# Patient Record
Sex: Female | Born: 1990 | Race: White | Hispanic: No | Marital: Single | Smoking: Current every day smoker
Health system: Southern US, Community
[De-identification: ages and names within clinical notes are randomized; demographics above are authoritative.]

## PROBLEM LIST (undated history)

## (undated) DIAGNOSIS — L98499 Non-pressure chronic ulcer of skin of other sites with unspecified severity: Secondary | ICD-10-CM

---

## 2010-06-23 ENCOUNTER — Emergency Department (HOSPITAL_COMMUNITY): Admission: EM | Admit: 2010-06-23 | Discharge: 2010-06-23 | Payer: Self-pay | Admitting: Emergency Medicine

## 2010-07-02 ENCOUNTER — Ambulatory Visit (HOSPITAL_COMMUNITY): Admission: RE | Admit: 2010-07-02 | Discharge: 2010-07-02 | Payer: Self-pay | Admitting: General Surgery

## 2011-01-04 ENCOUNTER — Emergency Department (HOSPITAL_COMMUNITY)
Admission: EM | Admit: 2011-01-04 | Discharge: 2011-01-04 | Payer: Self-pay | Source: Home / Self Care | Admitting: Emergency Medicine

## 2011-01-06 LAB — DIFFERENTIAL
Basophils Absolute: 0 10*3/uL (ref 0.0–0.1)
Basophils Relative: 0 % (ref 0–1)
Eosinophils Absolute: 0 10*3/uL (ref 0.0–0.7)
Eosinophils Relative: 0 % (ref 0–5)
Lymphocytes Relative: 7 % — ABNORMAL LOW (ref 12–46)
Lymphs Abs: 1 10*3/uL (ref 0.7–4.0)
Monocytes Absolute: 0.8 10*3/uL (ref 0.1–1.0)
Monocytes Relative: 6 % (ref 3–12)
Neutro Abs: 12.4 10*3/uL — ABNORMAL HIGH (ref 1.7–7.7)
Neutrophils Relative %: 87 % — ABNORMAL HIGH (ref 43–77)

## 2011-01-06 LAB — URINALYSIS, ROUTINE W REFLEX MICROSCOPIC
Hgb urine dipstick: NEGATIVE
Ketones, ur: >80 mg/dL — AB
Leukocytes, UA: NEGATIVE
Nitrite: NEGATIVE
Protein, ur: 30 mg/dL — AB
Specific Gravity, Urine: >1.03 — ABNORMAL HIGH (ref 1.005–1.030)
Urine Glucose, Fasting: NEGATIVE mg/dL
Urobilinogen, UA: 1 mg/dL (ref 0.0–1.0)
pH: 6 (ref 5.0–8.0)

## 2011-01-06 LAB — BASIC METABOLIC PANEL
BUN: 12 mg/dL (ref 6–23)
CO2: 20 mEq/L (ref 19–32)
Calcium: 9.1 mg/dL (ref 8.4–10.5)
Chloride: 105 mEq/L (ref 96–112)
Creatinine, Ser: 0.79 mg/dL (ref 0.4–1.2)
GFR calc Af Amer: >60 mL/min (ref >60)
GFR calc non Af Amer: >60 mL/min (ref >60)
Glucose, Bld: 76 mg/dL (ref 70–99)
Potassium: 3.8 mEq/L (ref 3.5–5.1)
Sodium: 138 mEq/L (ref 135–145)

## 2011-01-06 LAB — URINE MICROSCOPIC-ADD ON

## 2011-01-06 LAB — CBC
HCT: 39.5 % (ref 36.0–46.0)
Hemoglobin: 14.1 g/dL (ref 12.0–15.0)
MCH: 33.3 pg (ref 26.0–34.0)
MCHC: 35.7 g/dL (ref 30.0–36.0)
MCV: 93.2 fL (ref 78.0–100.0)
Platelets: 243 10*3/uL (ref 150–400)
RBC: 4.24 MIL/uL (ref 3.87–5.11)
RDW: 12.2 % (ref 11.5–15.5)
WBC: 14.2 10*3/uL — ABNORMAL HIGH (ref 4.0–10.5)

## 2011-03-07 LAB — CBC
HCT: 41.9 % (ref 36.0–46.0)
Hemoglobin: 14.5 g/dL (ref 12.0–15.0)
MCH: 34 pg (ref 26.0–34.0)
MCHC: 34.7 g/dL (ref 30.0–36.0)
MCV: 97.9 fL (ref 78.0–100.0)
Platelets: 194 10*3/uL (ref 150–400)
RBC: 4.28 MIL/uL (ref 3.87–5.11)
RDW: 12.8 % (ref 11.5–15.5)
WBC: 9.4 10*3/uL (ref 4.0–10.5)

## 2011-03-07 LAB — HCG, SERUM, QUALITATIVE: Preg, Serum: POSITIVE — AB

## 2012-05-12 IMAGING — CT CT HEAD W/O CM
3 of 5 series · 16 of 47 positions shown, 19 images · non-contrast
Comparison: None.

CLINICAL DATA: Head trauma secondary to a fall.

CT HEAD WITHOUT CONTRAST
TECHNIQUE: Contiguous axial images were obtained from the base of
the skull through the vertex without contrast.

[Series 4: max st 2.0 h31s · axial · 0.30mm/px · z∈[+14,+142]mm · 10 of 76 slices shown, 13 images]
[im 6/76  brain]
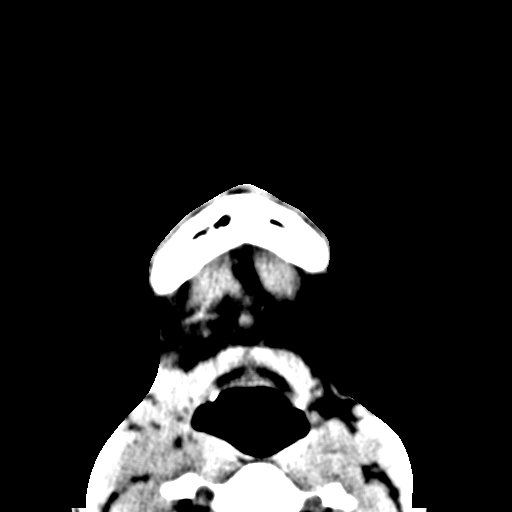
[im 6/76  bone]
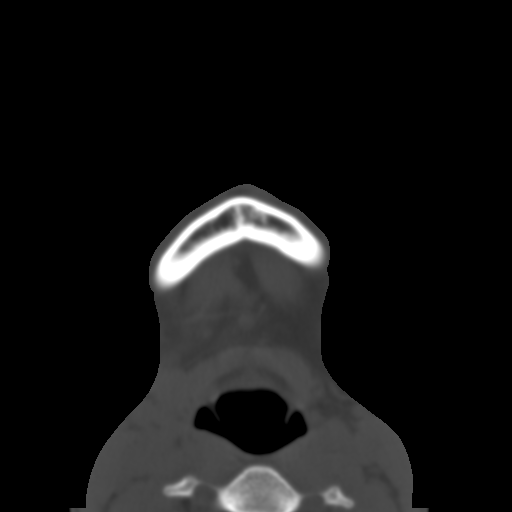
[im 12/76  brain]
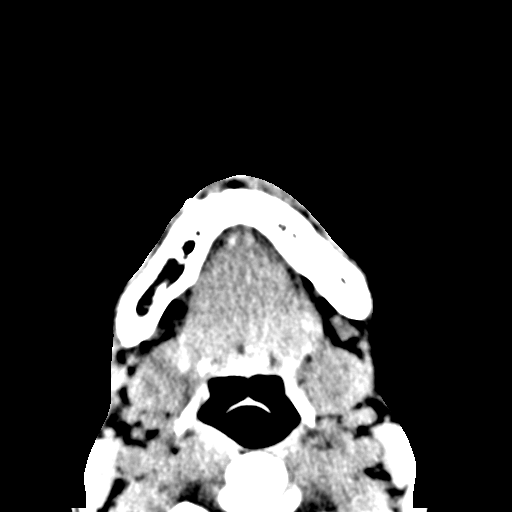
[im 24/76  brain]
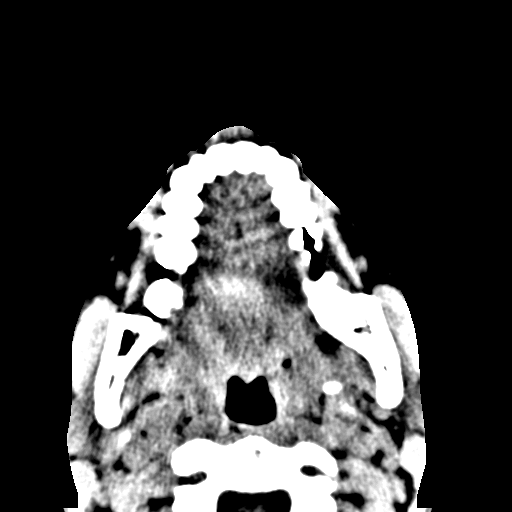
[im 29/76  brain]
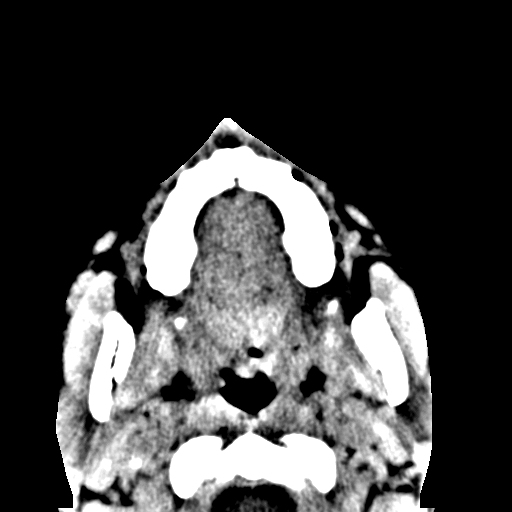
[im 35/76  brain]
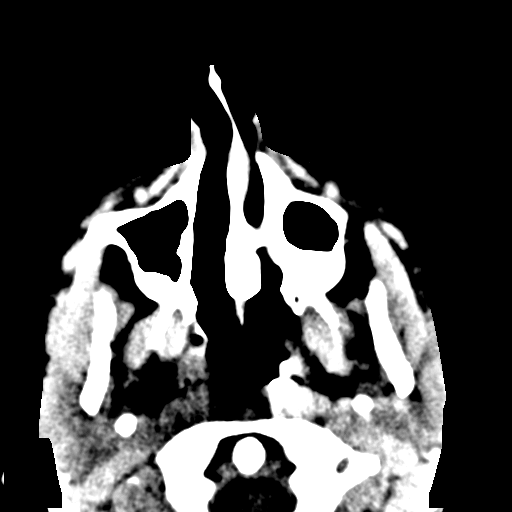
[im 35/76  bone]
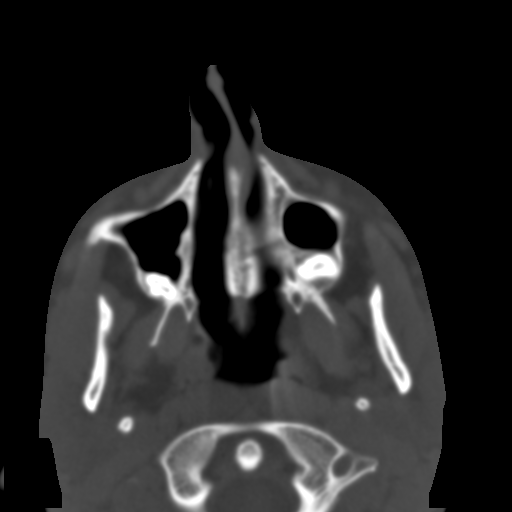
[im 41/76  brain]
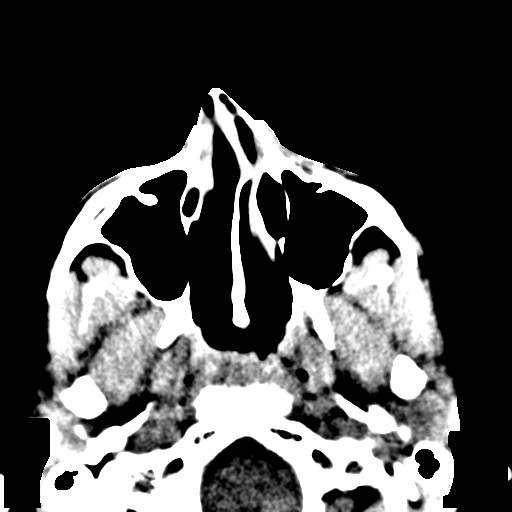
[im 47/76  brain]
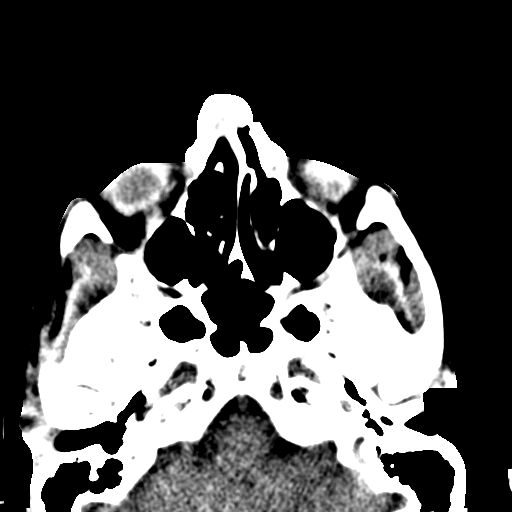
[im 58/76  brain]
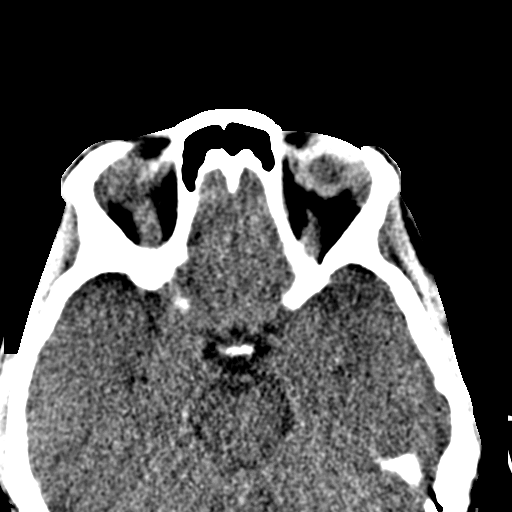
[im 64/76  brain]
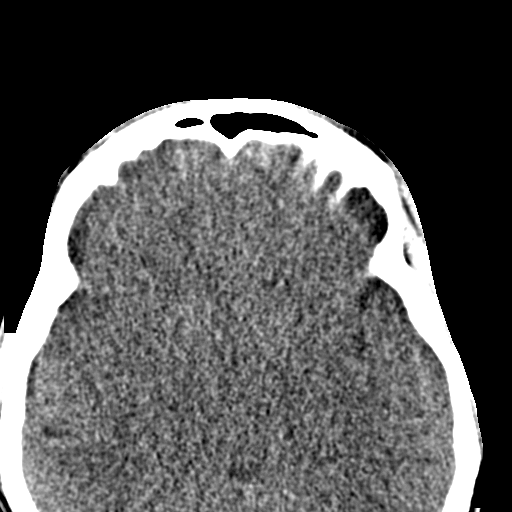
[im 64/76  bone]
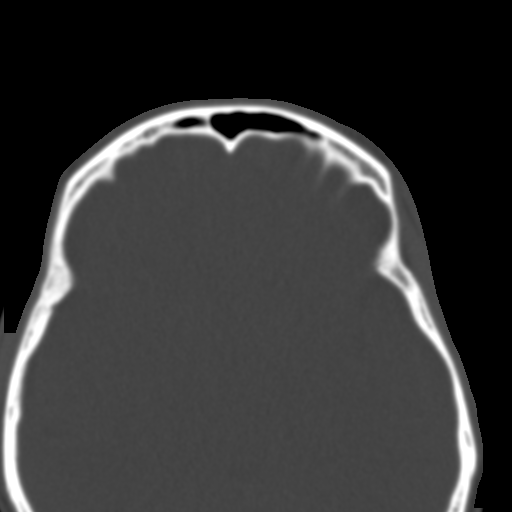
[im 70/76  brain]
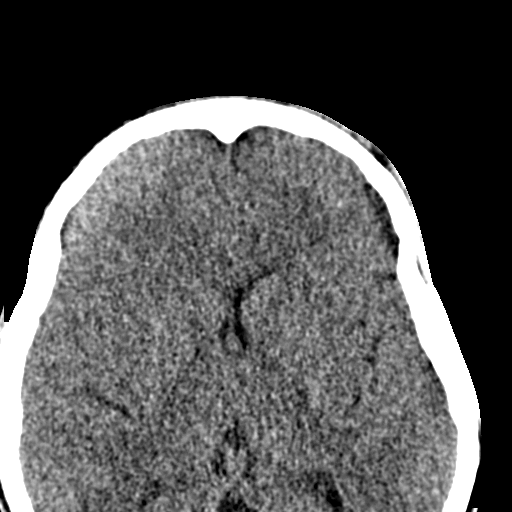

[Series 6: max st coronal · coronal · 0.28mm/px · 3 of 62 slices shown]
[im 21/62  brain]
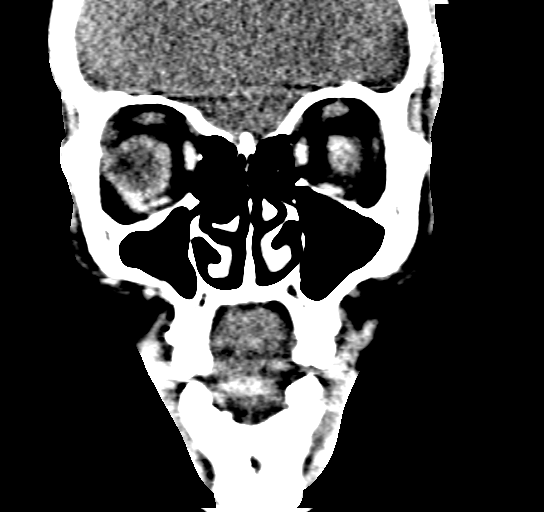
[im 28/62  brain]
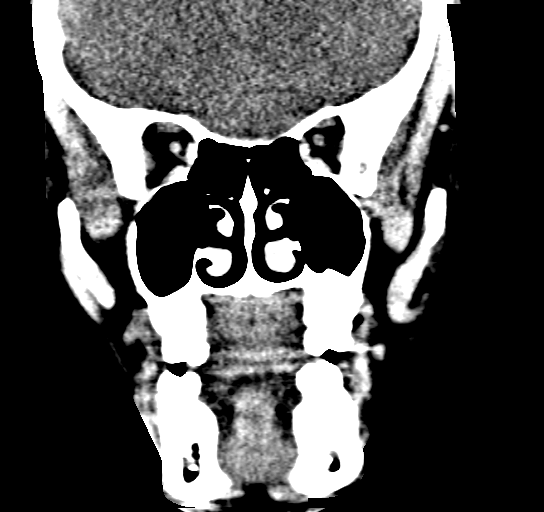
[im 34/62  brain]
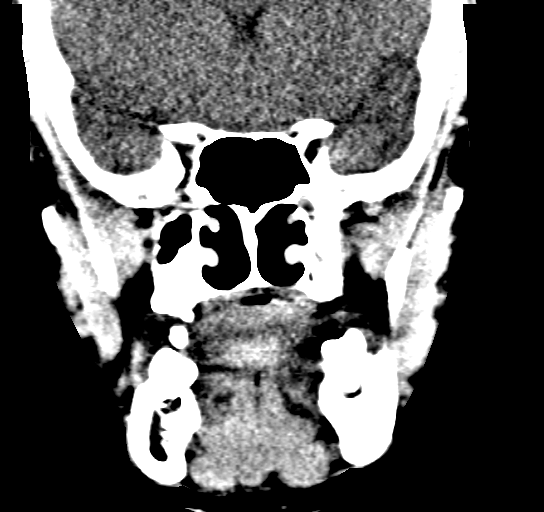

[Series 7: max st sag · sagittal · 0.29mm/px · 3 of 66 slices shown]
[im 22/66  brain]
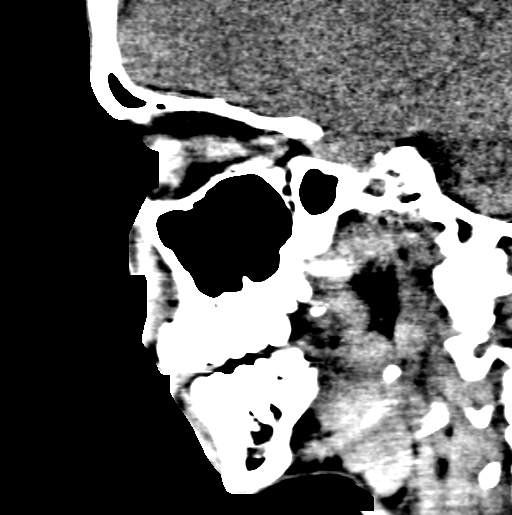
[im 33/66  brain]
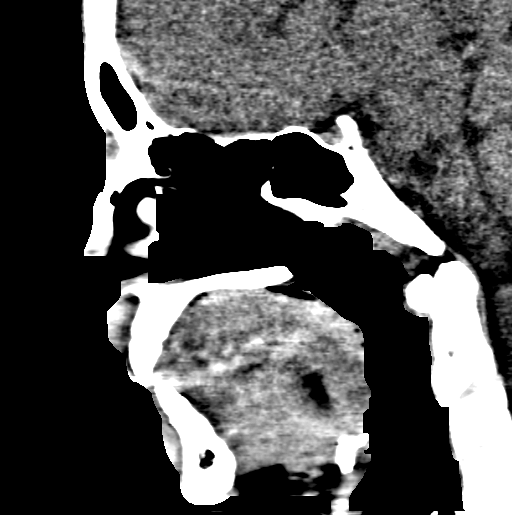
[im 44/66  brain]
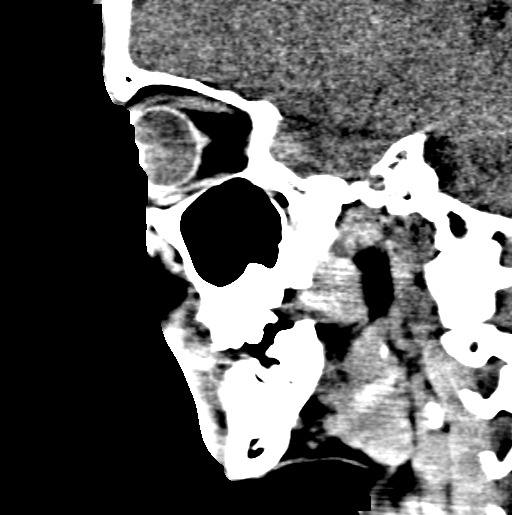

[16 of 47 positions shown; findings below may reference images not displayed]

FINDINGS: There is no  intracranial hemorrhage, acute infarction,
or intracranial mass lesion.  The brain parenchyma is normal.
There is a scalp hematoma above  the left orbit.  There is also a
displaced nasal bone fracture.

No other significant abnormalities.
IMPRESSION: 1.  No acute intracranial abnormality.
2.  Scalp hematoma.
3.  Nasal bone fracture.

## 2021-10-11 ENCOUNTER — Emergency Department (HOSPITAL_COMMUNITY): Payer: Medicaid Other

## 2021-10-11 ENCOUNTER — Emergency Department (HOSPITAL_COMMUNITY)
Admission: EM | Admit: 2021-10-11 | Discharge: 2021-10-11 | Disposition: A | Discharge status: Home / Self Care | Payer: Medicaid Other | Attending: Emergency Medicine | Admitting: Emergency Medicine

## 2021-10-11 ENCOUNTER — Encounter (HOSPITAL_COMMUNITY): Payer: Self-pay | Admitting: *Deleted

## 2021-10-11 DIAGNOSIS — E876 Hypokalemia: Secondary | ICD-10-CM | POA: Insufficient documentation

## 2021-10-11 DIAGNOSIS — J101 Influenza due to other identified influenza virus with other respiratory manifestations: Secondary | ICD-10-CM | POA: Insufficient documentation

## 2021-10-11 DIAGNOSIS — E871 Hypo-osmolality and hyponatremia: Secondary | ICD-10-CM | POA: Diagnosis not present

## 2021-10-11 DIAGNOSIS — J111 Influenza due to unidentified influenza virus with other respiratory manifestations: Secondary | ICD-10-CM

## 2021-10-11 DIAGNOSIS — F17210 Nicotine dependence, cigarettes, uncomplicated: Secondary | ICD-10-CM | POA: Insufficient documentation

## 2021-10-11 DIAGNOSIS — R079 Chest pain, unspecified: Secondary | ICD-10-CM

## 2021-10-11 DIAGNOSIS — R072 Precordial pain: Secondary | ICD-10-CM | POA: Diagnosis present

## 2021-10-11 DIAGNOSIS — Z20822 Contact with and (suspected) exposure to covid-19: Secondary | ICD-10-CM | POA: Insufficient documentation

## 2021-10-11 HISTORY — DX: Non-pressure chronic ulcer of skin of other sites with unspecified severity: L98.499

## 2021-10-11 LAB — TROPONIN I (HIGH SENSITIVITY)
Troponin I (High Sensitivity): 3 ng/L (ref <18)
Troponin I (High Sensitivity): 2 ng/L (ref <18)

## 2021-10-11 LAB — CBC WITH DIFFERENTIAL/PLATELET
Abs Immature Granulocytes: 0.01 10*3/uL (ref 0.00–0.07)
Basophils Absolute: 0 10*3/uL (ref 0.0–0.1)
Basophils Relative: 0 %
Eosinophils Absolute: 0 10*3/uL (ref 0.0–0.5)
Eosinophils Relative: 0 %
HCT: 46.1 % — ABNORMAL HIGH (ref 36.0–46.0)
Hemoglobin: 16.7 g/dL — ABNORMAL HIGH (ref 12.0–15.0)
Immature Granulocytes: 0 %
Lymphocytes Relative: 24 %
Lymphs Abs: 1.4 10*3/uL (ref 0.7–4.0)
MCH: 35 pg — ABNORMAL HIGH (ref 26.0–34.0)
MCHC: 36.2 g/dL — ABNORMAL HIGH (ref 30.0–36.0)
MCV: 96.6 fL (ref 80.0–100.0)
Monocytes Absolute: 0.7 10*3/uL (ref 0.1–1.0)
Monocytes Relative: 11 %
Neutro Abs: 3.7 10*3/uL (ref 1.7–7.7)
Neutrophils Relative %: 65 %
Platelets: 269 10*3/uL (ref 150–400)
RBC: 4.77 MIL/uL (ref 3.87–5.11)
RDW: 12.1 % (ref 11.5–15.5)
WBC: 5.8 10*3/uL (ref 4.0–10.5)
nRBC: 0 % (ref 0.0–0.2)

## 2021-10-11 LAB — BASIC METABOLIC PANEL
Anion gap: 11 (ref 5–15)
BUN: 7 mg/dL (ref 6–20)
CO2: 27 mmol/L (ref 22–32)
Calcium: 9 mg/dL (ref 8.9–10.3)
Chloride: 96 mmol/L — ABNORMAL LOW (ref 98–111)
Creatinine, Ser: 0.75 mg/dL (ref 0.44–1.00)
GFR, Estimated: >60 mL/min (ref >60)
Glucose, Bld: 84 mg/dL (ref 70–99)
Potassium: 3.3 mmol/L — ABNORMAL LOW (ref 3.5–5.1)
Sodium: 134 mmol/L — ABNORMAL LOW (ref 135–145)

## 2021-10-11 LAB — RESP PANEL BY RT-PCR (FLU A&B, COVID) ARPGX2
Influenza A by PCR: POSITIVE — AB
Influenza B by PCR: NEGATIVE
SARS Coronavirus 2 by RT PCR: NEGATIVE

## 2021-10-11 MED ORDER — KETOROLAC TROMETHAMINE 30 MG/ML IJ SOLN
30.0000 mg | Freq: Once | INTRAMUSCULAR | Status: AC
  Administered 2021-10-11: 30 mg via INTRAVENOUS
  Filled 2021-10-11: qty 1

## 2021-10-11 NOTE — ED Provider Notes (Signed)
[facility] EMERGENCY DEPARTMENT Provider Note   CSN: [id number] Arrival date & time: 10/11/21  1734     History Chief Complaint  Patient presents with   Chest Pain    Candace Mitchell is a 30 y.o. female.  She is here for evaluation of chest pain that is been intermittent for the last 3 days.  She said there is a constant pressure and intermittently there will be much more intense pressure that last for few minutes.  It does not seem to be provoked by exertion.  She is mildly short of breath.  About 2 days prior to this she had high fevers body aches headache cough productive of some white sputum.  Some loose stool.  She thought she had the flu.  The symptoms seem to be improving.  She is a daily smoker.  No past medical history  The history is provided by the patient.  Chest Pain Pain location:  Substernal area Pain quality: pressure   Pain radiates to:  Does not radiate Pain severity:  Moderate Onset quality:  Gradual Duration:  3 days Timing:  Intermittent Progression:  Unchanged Chronicity:  New Relieved by:  None tried Worsened by:  Nothing Ineffective treatments:  None tried Associated symptoms: cough, fever, headache and shortness of breath   Associated symptoms: no abdominal pain, no nausea and no vomiting   Risk factors: smoking   Risk factors: no coronary artery disease, no diabetes mellitus, no high cholesterol, no hypertension and no prior DVT/PE       Past Medical History:  Diagnosis Date   Ulcer of abdomen wall (HCC)     There are no problems to display for this patient.   History reviewed. No pertinent surgical history.   OB History   No obstetric history on file.     No family history on file.  Social History   Tobacco Use   Smoking status: Every Day    Types: Cigarettes   Smokeless tobacco: Never  Substance Use Topics   Alcohol use: Yes    Home Medications Prior to Admission medications   Not on File    Allergies    Patient has  no known allergies.  Review of Systems   Review of Systems  Constitutional:  Positive for fever.  HENT:  Negative for sore throat.   Eyes:  Negative for visual disturbance.  Respiratory:  Positive for cough and shortness of breath.   Cardiovascular:  Positive for chest pain.  Gastrointestinal:  Negative for abdominal pain, nausea and vomiting.  Genitourinary:  Negative for dysuria.  Musculoskeletal:  Positive for myalgias.  Skin:  Negative for rash.  Neurological:  Positive for headaches.   Physical Exam Updated Vital Signs BP 112/74   Pulse 86   Temp 98.4 F (36.9 C) (Oral)   Resp 15   LMP 09/20/2021   SpO2 100%   Physical Exam Vitals and nursing note reviewed.  Constitutional:      General: She is not in acute distress.    Appearance: Normal appearance. She is well-developed.  HENT:     Head: Normocephalic and atraumatic.  Eyes:     Conjunctiva/sclera: Conjunctivae normal.  Cardiovascular:     Rate and Rhythm: Normal rate and regular rhythm.     Heart sounds: Normal heart sounds. No murmur heard. Pulmonary:     Effort: Pulmonary effort is normal. No respiratory distress.     Breath sounds: Normal breath sounds.  Abdominal:     Palpations: Abdomen is  soft.     Tenderness: There is no abdominal tenderness. There is no guarding or rebound.  Musculoskeletal:        General: No tenderness. Normal range of motion.     Cervical back: Neck supple.     Right lower leg: No tenderness. No edema.     Left lower leg: No tenderness. No edema.  Skin:    General: Skin is warm and dry.     Capillary Refill: Capillary refill takes less than 2 seconds.  Neurological:     General: No focal deficit present.     Mental Status: She is alert.     Gait: Gait normal.    ED Results / Procedures / Treatments   Labs (all labs ordered are listed, but only abnormal results are displayed) Labs Reviewed  RESP PANEL BY RT-PCR (FLU A&B, COVID) ARPGX2 - Abnormal; Notable for the following  components:      Result Value   Influenza A by PCR POSITIVE (*)    All other components within normal limits  BASIC METABOLIC PANEL - Abnormal; Notable for the following components:   Sodium 134 (*)    Potassium 3.3 (*)    Chloride 96 (*)    All other components within normal limits  CBC WITH DIFFERENTIAL/PLATELET - Abnormal; Notable for the following components:   Hemoglobin 16.7 (*)    HCT 46.1 (*)    MCH 35.0 (*)    MCHC 36.2 (*)    All other components within normal limits  TROPONIN I (HIGH SENSITIVITY)  TROPONIN I (HIGH SENSITIVITY)    EKG EKG Interpretation  Date/Time:  Sunday October 11 2021 18:13:01 EDT Ventricular Rate:  90 PR Interval:  128 QRS Duration: 68 QT Interval:  364 QTC Calculation: 446 R Axis:   64 Text Interpretation: Sinus rhythm Biatrial enlargement No old tracing to compare Confirmed by Meridee Score [phone]) on 10/11/2021 6:14:26 PM  Radiology DG Chest Port 1 View  Result Date: 10/11/2021 CLINICAL DATA:  Chest pain. EXAM: PORTABLE CHEST 1 VIEW COMPARISON:  None. FINDINGS: Lungs are hyperinflated. Heart size is normal. No focal consolidations or pleural effusions. IMPRESSION: Hyperinflation of the lungs. Electronically Signed   By: Norva Pavlov M.D.   On: 10/11/2021 18:53    Procedures Procedures   Medications Ordered in ED Medications  ketorolac (TORADOL) 30 MG/ML injection 30 mg (30 mg Intravenous Given 10/11/21 2026)    ED Course  I have reviewed the triage vital signs and the nursing notes.  Pertinent labs & imaging results that were available during my care of the patient were reviewed by me and considered in my medical decision making (see chart for details).  Clinical Course as of 10/12/21 1033  Sun Oct 11, 2021  1851 Chest x-ray does not show any acute infiltrates or pneumothorax.  Awaiting radiology reading. [MB]    Clinical Course User Index [MB] Terrilee Files, MD   MDM Rules/Calculators/A&P                           Sonny Dandy was evaluated in Emergency Department on 10/11/2021 for the symptoms described in the history of present illness. She was evaluated in the context of the global COVID-19 pandemic, which necessitated consideration that the patient might be at risk for infection with the SARS-CoV-2 virus that causes COVID-19. Institutional protocols and algorithms that pertain to the evaluation of patients at risk for COVID-19 are in a state of rapid  change based on information released by regulatory bodies including the CDC and federal and state organizations. These policies and algorithms were followed during the patient's care in the ED.  This patient complains of intermittent chest pain in the setting of a recent cold; this involves an extensive number of treatment Options and is a complaint that carries with it a high risk of complications and Morbidity. The differential includes ACS, pneumonia, pneumothorax, COVID, flu, reflux, PE.  Patient is PERC negative.  I ordered, reviewed and interpreted labs, which included CBC with normal white count stable hemoglobin, chemistries with mildly low sodium and potassium, troponins flat, COVID-negative, flu a positive I ordered medication Toradol with improvement of patient's pain I ordered imaging studies which included chest x-ray and I independently    visualized and interpreted imaging which showed no acute findings Previous records obtained and reviewed in epic no recent admissions  After the interventions stated above, I reevaluated the patient and found patient to be hemodynamically stable and symptoms are improved.  Reviewed work-up with her.  Recommended symptomatic treatment at home and return instructions discussed   Final Clinical Impression(s) / ED Diagnoses Final diagnoses:  Nonspecific chest pain  Influenza    Rx / DC Orders ED Discharge Orders     None        Terrilee Files, MD 10/12/21 1035

## 2021-10-11 NOTE — Discharge Instructions (Signed)
You were seen in the emergency department for cough chest pain and body aches.  You had blood work that did not show any evidence of pneumonia or heart injury.  You did test positive for influenza.  Please rest and drink plenty of fluids Tylenol and ibuprofen for pain.  Follow-up with your doctor.  Return to the emergency department if any worsening or concerning symptoms.

## 2021-10-11 NOTE — ED Triage Notes (Signed)
Chest pressure x 3 days

## 2023-08-31 IMAGING — DX DG CHEST 1V PORT
1 series · 1 of 1 positions shown · non-contrast
Comparison: None.

CLINICAL DATA: Chest pain.

EXAM:
PORTABLE CHEST 1 VIEW

[chest ap]
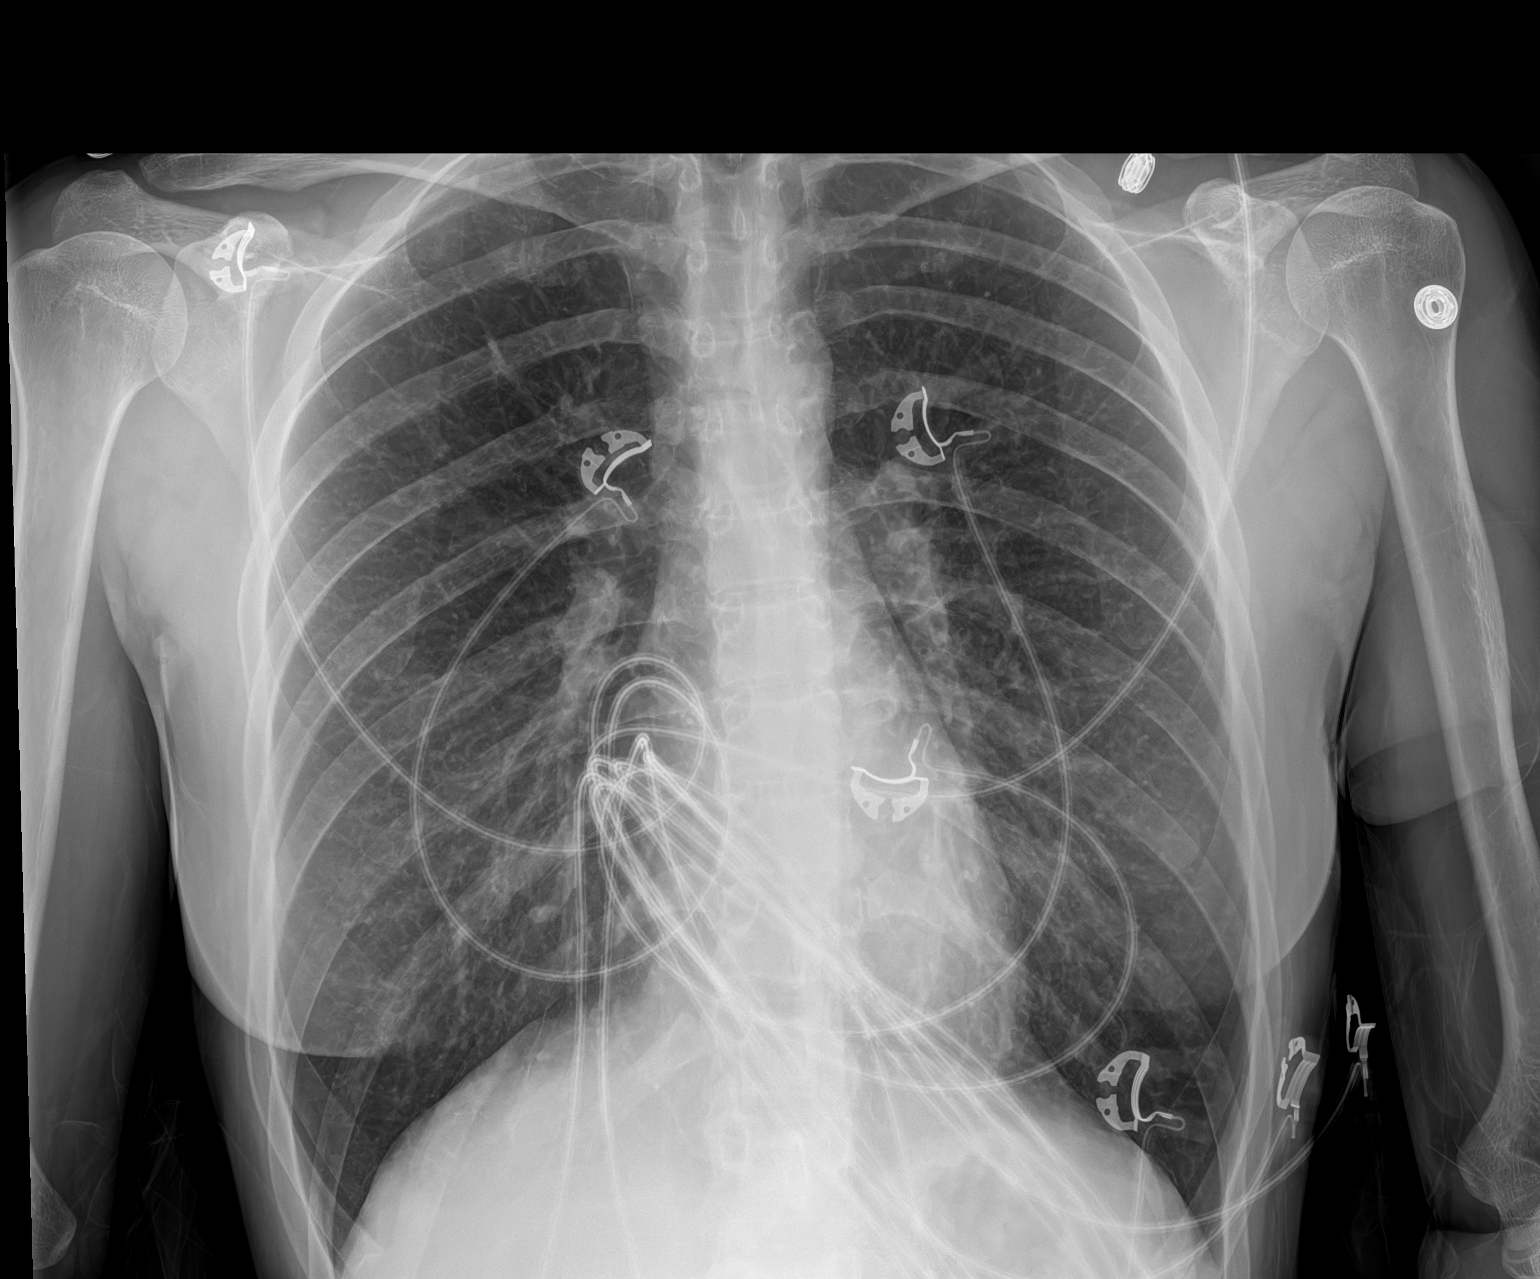

[1 of 1 positions shown; findings below may reference images not displayed]

FINDINGS: Lungs are hyperinflated. Heart size is normal. No focal
consolidations or pleural effusions.
IMPRESSION: Hyperinflation of the lungs.
